# Patient Record
Sex: Female | Born: 1945 | Race: White | Hispanic: No | Marital: Married | Smoking: Never smoker
Health system: Southern US, Community
[De-identification: ages and names within clinical notes are randomized; demographics above are authoritative.]

## PROBLEM LIST (undated history)

## (undated) DIAGNOSIS — Z8739 Personal history of other diseases of the musculoskeletal system and connective tissue: Secondary | ICD-10-CM

## (undated) DIAGNOSIS — Z8619 Personal history of other infectious and parasitic diseases: Secondary | ICD-10-CM

## (undated) DIAGNOSIS — B379 Candidiasis, unspecified: Secondary | ICD-10-CM

## (undated) DIAGNOSIS — Z6791 Unspecified blood type, Rh negative: Secondary | ICD-10-CM

## (undated) DIAGNOSIS — I10 Essential (primary) hypertension: Secondary | ICD-10-CM

## (undated) DIAGNOSIS — M069 Rheumatoid arthritis, unspecified: Secondary | ICD-10-CM

## (undated) HISTORY — DX: Personal history of other diseases of the musculoskeletal system and connective tissue: Z87.39

## (undated) HISTORY — DX: Personal history of other infectious and parasitic diseases: Z86.19

## (undated) HISTORY — PX: WISDOM TOOTH EXTRACTION: SHX21

## (undated) HISTORY — DX: Rheumatoid arthritis, unspecified: M06.9

## (undated) HISTORY — DX: Essential (primary) hypertension: I10

## (undated) HISTORY — PX: TUBAL LIGATION: SHX77

## (undated) HISTORY — DX: Unspecified blood type, rh negative: Z67.91

## (undated) HISTORY — DX: Candidiasis, unspecified: B37.9

---

## 2012-06-12 ENCOUNTER — Other Ambulatory Visit: Payer: Self-pay | Admitting: Obstetrics and Gynecology

## 2012-06-12 ENCOUNTER — Ambulatory Visit (INDEPENDENT_AMBULATORY_CARE_PROVIDER_SITE_OTHER): Payer: Federal, State, Local not specified - PPO | Admitting: Obstetrics and Gynecology

## 2012-06-12 ENCOUNTER — Encounter: Payer: Self-pay | Admitting: Obstetrics and Gynecology

## 2012-06-12 ENCOUNTER — Ambulatory Visit: Payer: Federal, State, Local not specified - PPO

## 2012-06-12 VITALS — BP 130/80 | Ht 64.0 in | Wt 198.0 lb

## 2012-06-12 DIAGNOSIS — Z13820 Encounter for screening for osteoporosis: Secondary | ICD-10-CM

## 2012-06-12 DIAGNOSIS — Z8739 Personal history of other diseases of the musculoskeletal system and connective tissue: Secondary | ICD-10-CM | POA: Insufficient documentation

## 2012-06-12 DIAGNOSIS — Z01419 Encounter for gynecological examination (general) (routine) without abnormal findings: Secondary | ICD-10-CM

## 2012-06-12 DIAGNOSIS — Z6791 Unspecified blood type, Rh negative: Secondary | ICD-10-CM | POA: Insufficient documentation

## 2012-06-12 DIAGNOSIS — Z124 Encounter for screening for malignant neoplasm of cervix: Secondary | ICD-10-CM

## 2012-06-12 DIAGNOSIS — Z1231 Encounter for screening mammogram for malignant neoplasm of breast: Secondary | ICD-10-CM

## 2012-06-12 DIAGNOSIS — M069 Rheumatoid arthritis, unspecified: Secondary | ICD-10-CM | POA: Insufficient documentation

## 2012-06-12 DIAGNOSIS — B379 Candidiasis, unspecified: Secondary | ICD-10-CM | POA: Insufficient documentation

## 2012-06-12 DIAGNOSIS — I10 Essential (primary) hypertension: Secondary | ICD-10-CM | POA: Insufficient documentation

## 2012-06-12 DIAGNOSIS — N952 Postmenopausal atrophic vaginitis: Secondary | ICD-10-CM

## 2012-06-12 MED ORDER — ESTRADIOL 10 MCG VA TABS: 1.0000 | ORAL_TABLET | VAGINAL | Status: AC

## 2012-06-12 NOTE — Progress Notes (Signed)
The patient is not taking hormone replacement therapy The patient  is not taking a Calcium supplement. Post-menopausal bleeding:no  Last Pap: approximate date 2011 and was normal  Last mammogram: approximate date 2012 and was normal Last DEXA scan : T= 1.2 2009 Last colonoscopy:n/a  Urinary symptoms: none Normal bowel movements: Yes Reports abuse at home: No:  Subjective:    Vicki Parker is a 66 y.o. female G3P3 who presents for annual exam.  The patient has no complaints today.   The following portions of the patient's history were reviewed and updated as appropriate: allergies, current medications, past family history, past medical history, past social history, past surgical history and problem list.  Review of Systems Pertinent items are noted in HPI. Gastrointestinal:No change in bowel habits, no abdominal pain, no rectal bleeding Genitourinary:negative for dysuria, frequency, hematuria, nocturia and urinary incontinence    Objective:     There were no vitals taken for this visit.  Weight:  Wt Readings from Last 1 Encounters:  No data found for Wt     BMI: There is no height or weight on file to calculate BMI. General Appearance: Alert, appropriate appearance for age. No acute distress HEENT: Grossly normal Neck / Thyroid: Supple, no masses, nodes or enlargement Lungs: clear to auscultation bilaterally Back: No CVA tenderness Breast Exam: No masses or nodes.No dimpling, nipple retraction or discharge. Cardiovascular: Regular rate and rhythm. S1, S2, no murmur Gastrointestinal: Soft, non-tender, no masses or organomegaly Pelvic Exam: Vulva and vagina have moderate atrophy with vaginal petechia. Bimanual exam reveals normal uterus and adnexa. Rectovaginal: normal rectal, no masses Lymphatic Exam: Non-palpable nodes in neck, clavicular, axillary, or inguinal regions  Skin: no rash or abnormalities Neurologic: Normal gait and speech, no tremor  Psychiatric: Alert and  oriented, appropriate affect.     Assessment:    Normal gyn exam  Moderate atrophy   Plan:   mammogram pap smear return annually or prn DEXA: normal today Vagifem 2 x weekly for 3 months then PRN Follow-up:  for annual exam   Silverio Lay MD

## 2012-06-13 LAB — PAP IG W/ RFLX HPV ASCU

## 2012-06-14 ENCOUNTER — Encounter: Payer: Self-pay | Admitting: Internal Medicine

## 2012-07-24 ENCOUNTER — Ambulatory Visit
Admission: RE | Admit: 2012-07-24 | Discharge: 2012-07-24 | Disposition: A | Discharge status: Home / Self Care | Payer: Medicare Other | Source: Ambulatory Visit | Attending: Obstetrics and Gynecology | Admitting: Obstetrics and Gynecology

## 2012-07-24 DIAGNOSIS — Z1231 Encounter for screening mammogram for malignant neoplasm of breast: Secondary | ICD-10-CM

## 2013-06-19 ENCOUNTER — Other Ambulatory Visit: Payer: Self-pay

## 2013-06-19 DIAGNOSIS — Z1231 Encounter for screening mammogram for malignant neoplasm of breast: Secondary | ICD-10-CM

## 2013-07-20 ENCOUNTER — Inpatient Hospital Stay: Payer: Self-pay | Admitting: Specialist

## 2013-07-20 LAB — COMPREHENSIVE METABOLIC PANEL
Alkaline Phosphatase: 171 U/L — ABNORMAL HIGH
BUN: 7 mg/dL (ref 7–18)
Calcium, Total: 9 mg/dL (ref 8.5–10.1)
Chloride: 102 mmol/L (ref 98–107)
EGFR (African American): >60
Glucose: 160 mg/dL — ABNORMAL HIGH (ref 65–99)
Osmolality: 275 (ref 275–301)
Potassium: 3.4 mmol/L — ABNORMAL LOW (ref 3.5–5.1)
SGOT(AST): 97 U/L — ABNORMAL HIGH (ref 15–37)

## 2013-07-20 LAB — CBC WITH DIFFERENTIAL/PLATELET
Basophil: 2 %
Comment - H1-Com1: NORMAL
Eosinophil #: 0 10*3/uL (ref 0.0–0.7)
Eosinophil %: 0.3 %
HGB: 13.3 g/dL (ref 12.0–16.0)
Lymphocyte %: 10.2 %
MCHC: 34.5 g/dL (ref 32.0–36.0)
Monocyte %: 10 %
Monocytes: 12 %
Platelet: 296 10*3/uL (ref 150–440)
RDW: 13.6 % (ref 11.5–14.5)
Segmented Neutrophils: 73 %
Variant Lymphocyte - H1-Rlymph: 3 %

## 2013-07-20 LAB — RAPID INFLUENZA A&B ANTIGENS

## 2013-07-20 LAB — HEMOGLOBIN A1C: Hemoglobin A1C: 5.9 % (ref 4.2–6.3)

## 2013-07-21 LAB — HEPATIC FUNCTION PANEL A (ARMC)
Albumin: 2.6 g/dL — ABNORMAL LOW (ref 3.4–5.0)
Alkaline Phosphatase: 153 U/L — ABNORMAL HIGH
Bilirubin, Direct: 0.1 mg/dL (ref 0.00–0.20)
Bilirubin,Total: 0.5 mg/dL (ref 0.2–1.0)
SGOT(AST): 40 U/L — ABNORMAL HIGH (ref 15–37)
SGPT (ALT): 105 U/L — ABNORMAL HIGH (ref 12–78)

## 2013-07-21 LAB — MAGNESIUM: Magnesium: 2.2 mg/dL

## 2013-07-21 LAB — BASIC METABOLIC PANEL
BUN: 6 mg/dL — ABNORMAL LOW (ref 7–18)
Calcium, Total: 8.7 mg/dL (ref 8.5–10.1)
Chloride: 103 mmol/L (ref 98–107)
Co2: 26 mmol/L (ref 21–32)
EGFR (Non-African Amer.): >60
Glucose: 143 mg/dL — ABNORMAL HIGH (ref 65–99)
Potassium: 4.2 mmol/L (ref 3.5–5.1)

## 2013-07-21 LAB — CBC WITH DIFFERENTIAL/PLATELET
Basophil %: 0.4 %
Eosinophil #: 0.1 10*3/uL (ref 0.0–0.7)
HGB: 11.8 g/dL — ABNORMAL LOW (ref 12.0–16.0)
Lymphocyte #: 1.5 10*3/uL (ref 1.0–3.6)
Lymphocyte %: 8.8 %
MCHC: 34 g/dL (ref 32.0–36.0)
Neutrophil %: 79.4 %
RBC: 3.8 10*6/uL (ref 3.80–5.20)

## 2013-07-22 LAB — CBC WITH DIFFERENTIAL/PLATELET
Basophil #: 0.1 10*3/uL (ref 0.0–0.1)
Basophil %: 0.6 %
Eosinophil #: 0.4 10*3/uL (ref 0.0–0.7)
Eosinophil %: 3.1 %
HGB: 12.1 g/dL (ref 12.0–16.0)
Lymphocyte #: 1.9 10*3/uL (ref 1.0–3.6)
Lymphocyte %: 16.9 %
MCH: 30.5 pg (ref 26.0–34.0)
MCHC: 33.4 g/dL (ref 32.0–36.0)
Monocyte %: 13.7 %
Neutrophil #: 7.4 10*3/uL — ABNORMAL HIGH (ref 1.4–6.5)
Neutrophil %: 65.7 %
RBC: 3.98 10*6/uL (ref 3.80–5.20)
RDW: 13.7 % (ref 11.5–14.5)
WBC: 11.3 10*3/uL — ABNORMAL HIGH (ref 3.6–11.0)

## 2013-07-25 LAB — CULTURE, BLOOD (SINGLE)

## 2013-07-28 ENCOUNTER — Ambulatory Visit
Admission: RE | Admit: 2013-07-28 | Discharge: 2013-07-28 | Disposition: A | Discharge status: Home / Self Care | Payer: Medicare Other | Source: Ambulatory Visit

## 2013-07-28 DIAGNOSIS — Z1231 Encounter for screening mammogram for malignant neoplasm of breast: Secondary | ICD-10-CM

## 2013-08-28 ENCOUNTER — Ambulatory Visit: Payer: Self-pay

## 2014-06-15 ENCOUNTER — Encounter: Payer: Self-pay | Admitting: Obstetrics and Gynecology

## 2014-06-19 ENCOUNTER — Other Ambulatory Visit: Payer: Self-pay

## 2014-06-19 DIAGNOSIS — Z1231 Encounter for screening mammogram for malignant neoplasm of breast: Secondary | ICD-10-CM

## 2014-07-29 ENCOUNTER — Ambulatory Visit
Admission: RE | Admit: 2014-07-29 | Discharge: 2014-07-29 | Disposition: A | Discharge status: Home / Self Care | Payer: Medicare Other | Source: Ambulatory Visit

## 2014-07-29 DIAGNOSIS — Z1231 Encounter for screening mammogram for malignant neoplasm of breast: Secondary | ICD-10-CM

## 2014-12-04 NOTE — H&P (Signed)
PATIENT NAME:  Vicki Parker, Vicki Parker MR#:  [id number] DATE OF BIRTH:  1945/12/06  DATE OF ADMISSION:  07/20/2013  PRIMARY CARE PHYSICIAN: None.  REFERRING PHYSICIAN:  Dr. Corky Downs  CHIEF COMPLAINT: Shortness of breath and cough.  HISTORY OF PRESENT ILLNESS: The patient is a pleasant, 69 year old, obese female, with a history of rheumatoid arthritis, OA, and hypertension. The patient, of note, is on Enbrel for her rheumatoid arthritis, and previously was on methotrexate. She states that around Thanksgiving she had some contacts who came in and had upper respiratory symptoms. They were some grandkids. She started to have some body aches and head cold with nasal congestion and cough. She started to have progression of her symptoms, and around Thursday started to have fevers, went to an urgent care walk-in clinic, and was given azithromycin  after a nose swab. She does not know what the nose swab was. She has taken 3 days of azithromycin; however, has not gotten better. She came into the hospital, where she was found tachycardic, with a fever greater than 102, and white count of 18, with evidence for pneumonia. She also had O2 sat of mid-80s with ambulation. Hospitalist services were contacted for further evaluation and management.   PAST MEDICAL HISTORY: 1.  RA.  2.  OA. 3.  Hypertension.   ALLERGIES: None.   SURGERIES:  C-section x 3.   FAMILY HISTORY: Father with stroke.   SOCIAL HISTORY:  Ex-smoker, quit more than 20 years ago. Occasional alcohol. No drugs.   OUTPATIENT MEDICATIONS: Enbrel q. weekly, did not take the last dose, as she was instructed by her rheumatologist to not take it while she was on the antibiotics. Aspirin 81 mg daily, Exforge 10/320, 1 tab once a day, Flonase 1 spray once a day, folic acid 1 mg daily, hydrochlorothiazide 12.5 mg daily, Hydromet 5 mg every 4 hours as needed, potassium chloride 10 mEq 2 times a day, and Z-Pak, today is her day 4.   REVIEW OF SYSTEMS:   CONSTITUTIONAL: Positive for fever, fatigue, weakness.  EYES: No blurry vision or double vision.  ENT: No tinnitus or hearing loss. No sore throat.  RESPIRATORY: Positive for cough. No wheezing. Cough has been dry up until some neb treatments, after which she has had some productive sputum today. The patient has pain with respiration on the right side. She is short of breath as well.  CARDIOVASCULAR: No left-sided chest pain. No orthopnea or swelling in the legs. Has hypertension.  GASTROINTESTINAL: No nausea, vomiting, diarrhea, abdominal pain, black stools or dark tarry stools.  GENITOURINARY: Denies dysuria, hematuria. Denies frequency, but has had some incontinence with her coughing spells.  ENDOCRINE: Denies polyuria or nocturia.  HEMATOLOGIC AND LYMPHATIC: Denies anemia or easy bruising.  SKIN: No rashes.  MUSCULOSKELETAL: Has rheumatoid arthritis and osteoarthritis. NEUROLOGIC:  No focal weakness or numbness.  PSYCHIATRIC: Denies anxiety or depression.   PHYSICAL EXAMINATION: VITAL SIGNS: Temperature on arrival was 102.5, pulse rate 112, respiratory rate 20, blood pressure 140/69, O2 sat 93% resting on room air, but dropped to mid-80s with ambulation. GENERAL: The patient is an obese female lying in bed in no obvious distress.  HEENT: Normocephalic, atraumatic. Pupils are equal and reactive. Anicteric sclerae. Extraocular muscles intact. Somewhat dry mucous membranes.  NECK: Supple. No thyroid tenderness. No cervical lymphadenopathy.  CARDIOVASCULAR: S1, S2. Tachycardic. No significant murmurs, rubs or gallops.  LUNGS: On the right side, there is rhonchi and mild wheezing. Good air entry on the left.  ABDOMEN: Soft, nontender, nondistended.  Positive bowel sounds in all quadrants.  EXTREMITIES: No significant lower extremity edema.  SKIN: No obvious rashes.  NEUROLOGIC: Cranial nerves II through XII grossly intact. Strength is 5/5 all extremities. Sensation intact to light touch.   PSYCHIATRIC: Awake, alert, oriented x 3. Pleasant, cooperative.   LABS AND IMAGING:  CT chest without contrast shows patchy opacities in the posterior right lower lobe suspicious for pneumonia. X-ray of the chest: No active cardiopulmonary disease. Rapid flu negative. White count 18,000. Bilirubin 0.7, alk phos 171, AST 97, ALT 153. BUN 7, creatinine 0.74, sodium 137, potassium 3.4, glucose 160.   ASSESSMENT AND PLAN: We have a pleasant, 69 year old female, with rheumatoid arthritis, on weekly Enbrel injections, who has been having progressive respiratory symptoms since about Thanksgiving, with some sick contacts. Currently, she appears to have severe sepsis, per criteria, including tachycardia, leukocytosis, fever, in the setting of pneumonia, in addition to respiratory failure, which is acute. Acute respiratory failure is secondary to the pneumonia, I believe. At this point, would admit the patient to the hospital, start her on oxygen as needed, and start her on Levaquin, and given the situation with Enbrel, I would also go ahead and add Rocephin at this point for double coverage. I would order sputum cultures and start the patient on nebs and  Robitussin for her symptoms. She appears to be talking in full sentences at this point, and does not appear to be significantly labored, and if she does not respond with these 2 antibiotics, I would consider broadening the antibiotics. I would also check a urine Legionella and strep antigens. Although the symptoms initially started as viral, I believe that she has developed a secondary bacterial pneumonia superinfection. She has been tested negative for rapid flu here, and as her symptoms started greater than 10 days ago, I suspect this is likely, as it started off being viral, is more likely bacterial at this point. She has mild hypokalemia, and I would go ahead and replete that. The patient has mild transaminitis, and I would go ahead and recheck another in the  morning, and if they are persistently elevated, would consider ultrasound of right upper quadrant, but I think this is likely secondary to her sepsis. In regards to the hypertension, I would go ahead and continue some of her blood pressure medications. I would start her on Lovenox for deep vein thrombosis prophylaxis.   Total time spent is 60 minutes.   The patient is FULL CODE.    ____________________________ Vivien Presto, MD sa:mr D: 07/20/2013 14:07:24 ET T: 07/20/2013 16:20:42 ET JOB#: [id number]  cc: Vivien Presto, MD, <Dictator> Vivien Presto MD ELECTRONICALLY SIGNED 07/29/2013 11:03

## 2014-12-04 NOTE — Discharge Summary (Signed)
PATIENT NAME:  Vicki DinningENGERES, Alton MR#:  [id number] DATE OF BIRTH:  [birth year]  DATE OF ADMISSION:  07/20/2013 DATE OF DISCHARGE:  07/22/2013  For a detailed note, please look at the history and physical done at admission by Dr. Jacques NavyAhmadzia.     DIAGNOSES AT DISCHARGE: As follows:  1.  Systemic inflammatory response syndrome secondary to pneumonia. 2.  Pneumonia.  3.  Hypertension.  4.  Rheumatoid arthritis.   DIET: The patient is being discharged on a low-sodium diet.   ACTIVITY: As tolerated.   FOLLOW-UP:  With her primary care physician, in the next 1 to 2 weeks.   DISCHARGED MEDICATIONS:  Toprol 50 mg b.i.d., Exforge 10/320, one tab daily, HCTZ 12.5 mg daily, aspirin 81 mg daily, folic acid 1 mg daily, methotrexate 2.5 mg weekly, leucovorin 5 mg weekly, Flonase one spray to each nostril daily, Hydromet 1.5 mg/5 mg 5 mL syrup 5 mL q. 4 hours as needed, potassium 10 mEq daily, Levaquin 750 mg daily x 7 days.   PERTINENT STUDIES DONE DURING THE HOSPITAL COURSE: Is as follows: Chest x-ray done on admission showing no acute cardiopulmonary disease. CT of the chest done without contrast on admission showing multifocal patchy opacities in the posterior right lower lobe consistent with pneumonia.   HOSPITAL COURSE: This is a 69 year old female with medical problems as mentioned above, presented to the hospital with fever, tachycardia, leukocytosis and shortness of breath and likely secondary to pneumonia.   PROBLEMS: 1.  Systemic inflammatory response syndrome. This was likely secondary to pneumonia in the right lower lobe. The patient was treated empirically with IV ceftriaxone and Levaquin and given IV fluids and antipyretics. The patient has clinically significantly improved since admission. She has been afebrile now for the past 48 hours and hemodynamically stable. Her blood cultures remained negative. She clinically feels no shortness of breath or any pleurisy or any cough or upper respiratory  symptoms. She is being discharged to oral Levaquin for suspected pneumonia.  2.  Pneumonia, likely community-acquired. The patient was treated with IV ceftriaxone and Levaquin while in the hospital. Her blood cultures remained negative. She is currently afebrile and hemodynamically stable. She is being discharged on oral Levaquin for the next 7 days.  3.  Leukocytosis. This was likely secondary to pneumonia. It has significantly improved and now resolved with IV antibiotic therapy.  4.  Constipation. The patient has not had a BM for 3 to 4 days prior to coming to the hospital. She received some lactulose and this has worked effectively and she had a BM yesterday.  5.  Hypertension. The patient remained hemodynamically stable. She will continue her home medications including her Toprol, HCTZ and Exforge as stated.  6.  Rheumatoid arthritis. The patient will continue follow-up with her rheumatologist. Continue methotrexate and leucovorin and folic acid supplements as stated.   CODE STATUS: The patient is a FULL CODE.   TIME SPENT ON DISCHARGE: 40 minutes   ____________________________ Rolly PancakeVivek J. Cherlynn KaiserSainani, MD vjs:dp D: 07/22/2013 14:49:00 ET T: 07/22/2013 15:48:32 ET JOB#: [id number]  cc: Rolly PancakeVivek J. Cherlynn KaiserSainani, MD, <Dictator> Krystal EatonShayiq Ahmadzia, MD Houston SirenVIVEK J SAINANI MD ELECTRONICALLY SIGNED 07/24/2013 14:32

## 2015-03-16 IMAGING — CR DG CHEST 2V
1 series · 2 of 2 positions shown · non-contrast
Comparison: None.

CLINICAL DATA: Cough x1 week

EXAM:
CHEST  2 VIEW

[Series 1: w chest pa · 0.14mm/px · 2 of 2 slices shown]
[im 1/2]
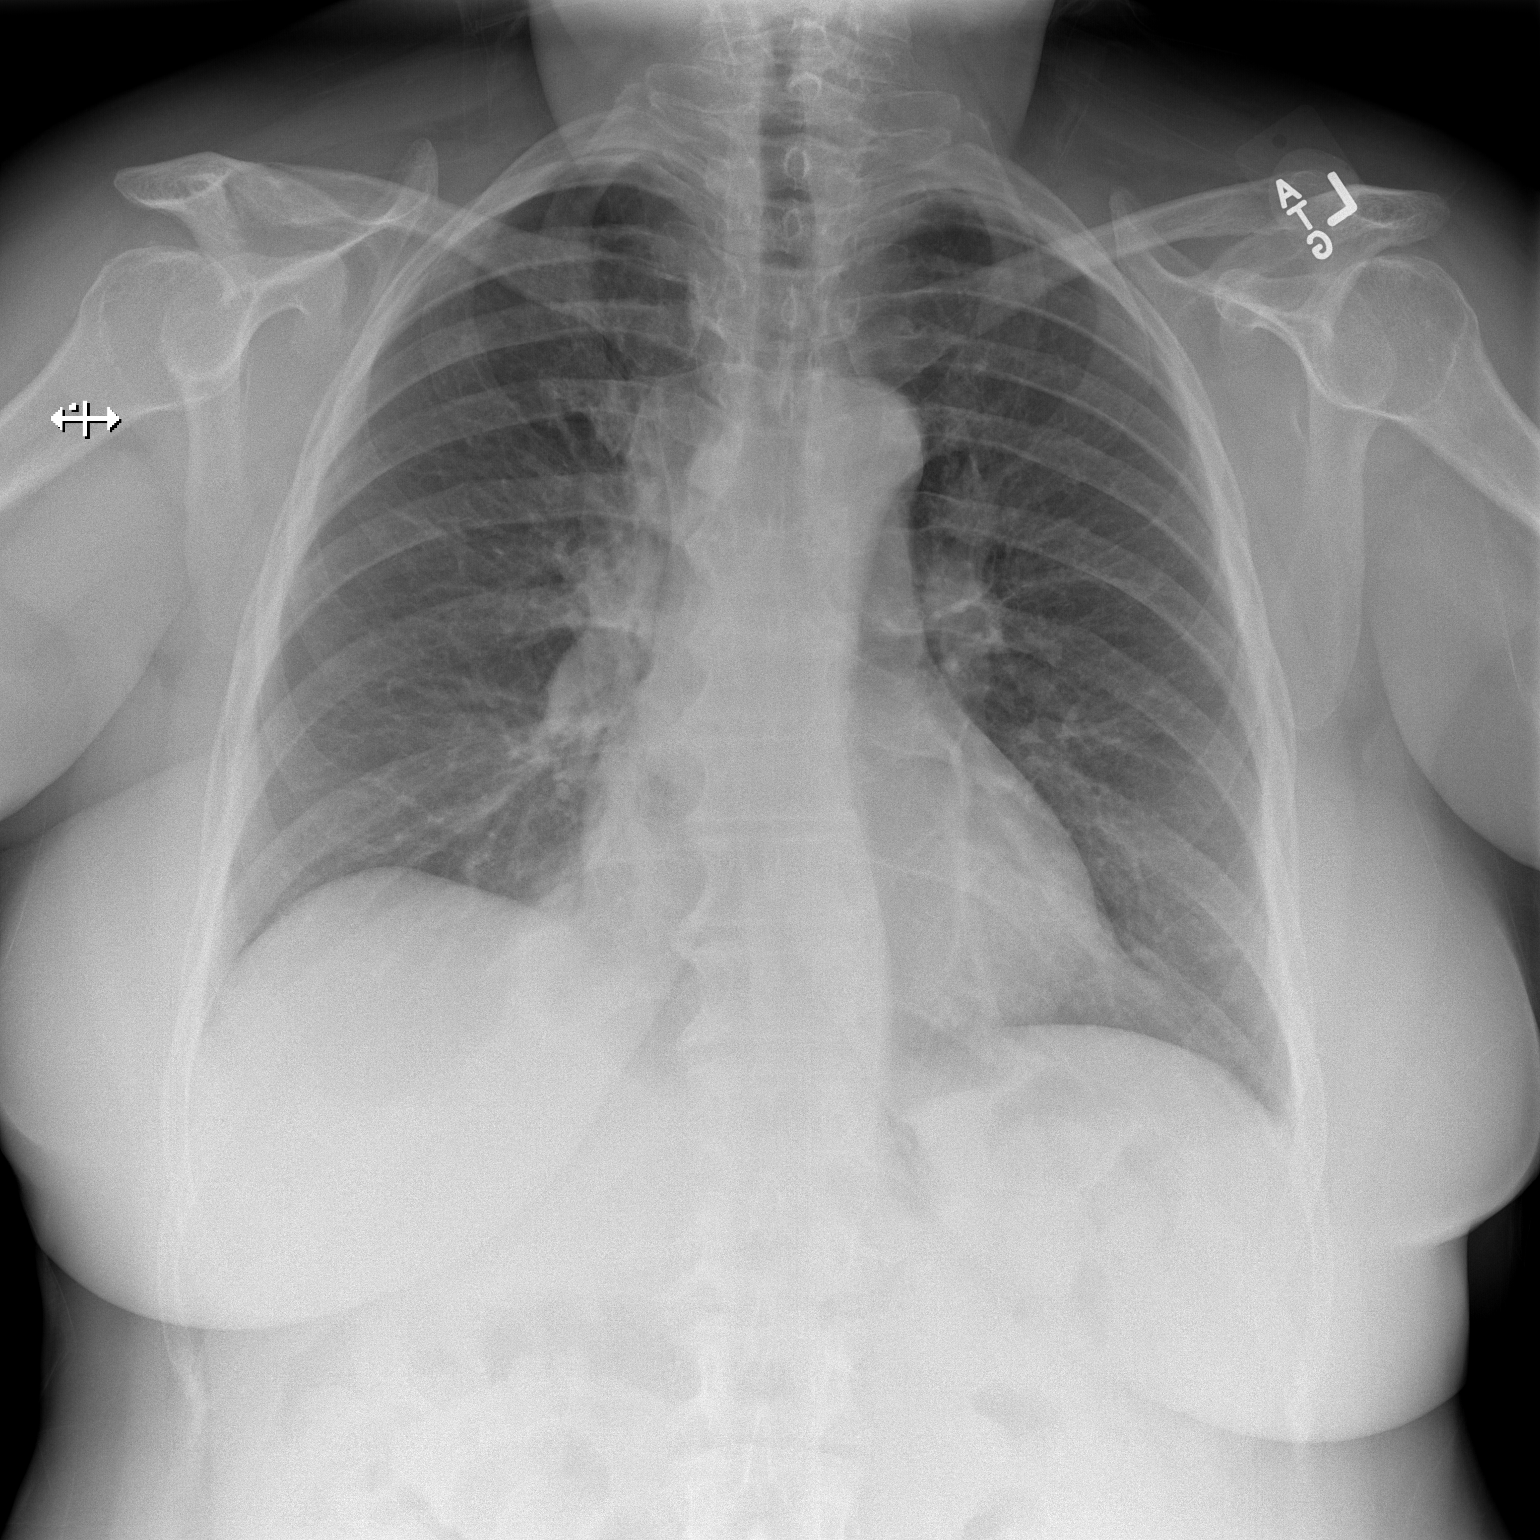
[im 2/2]
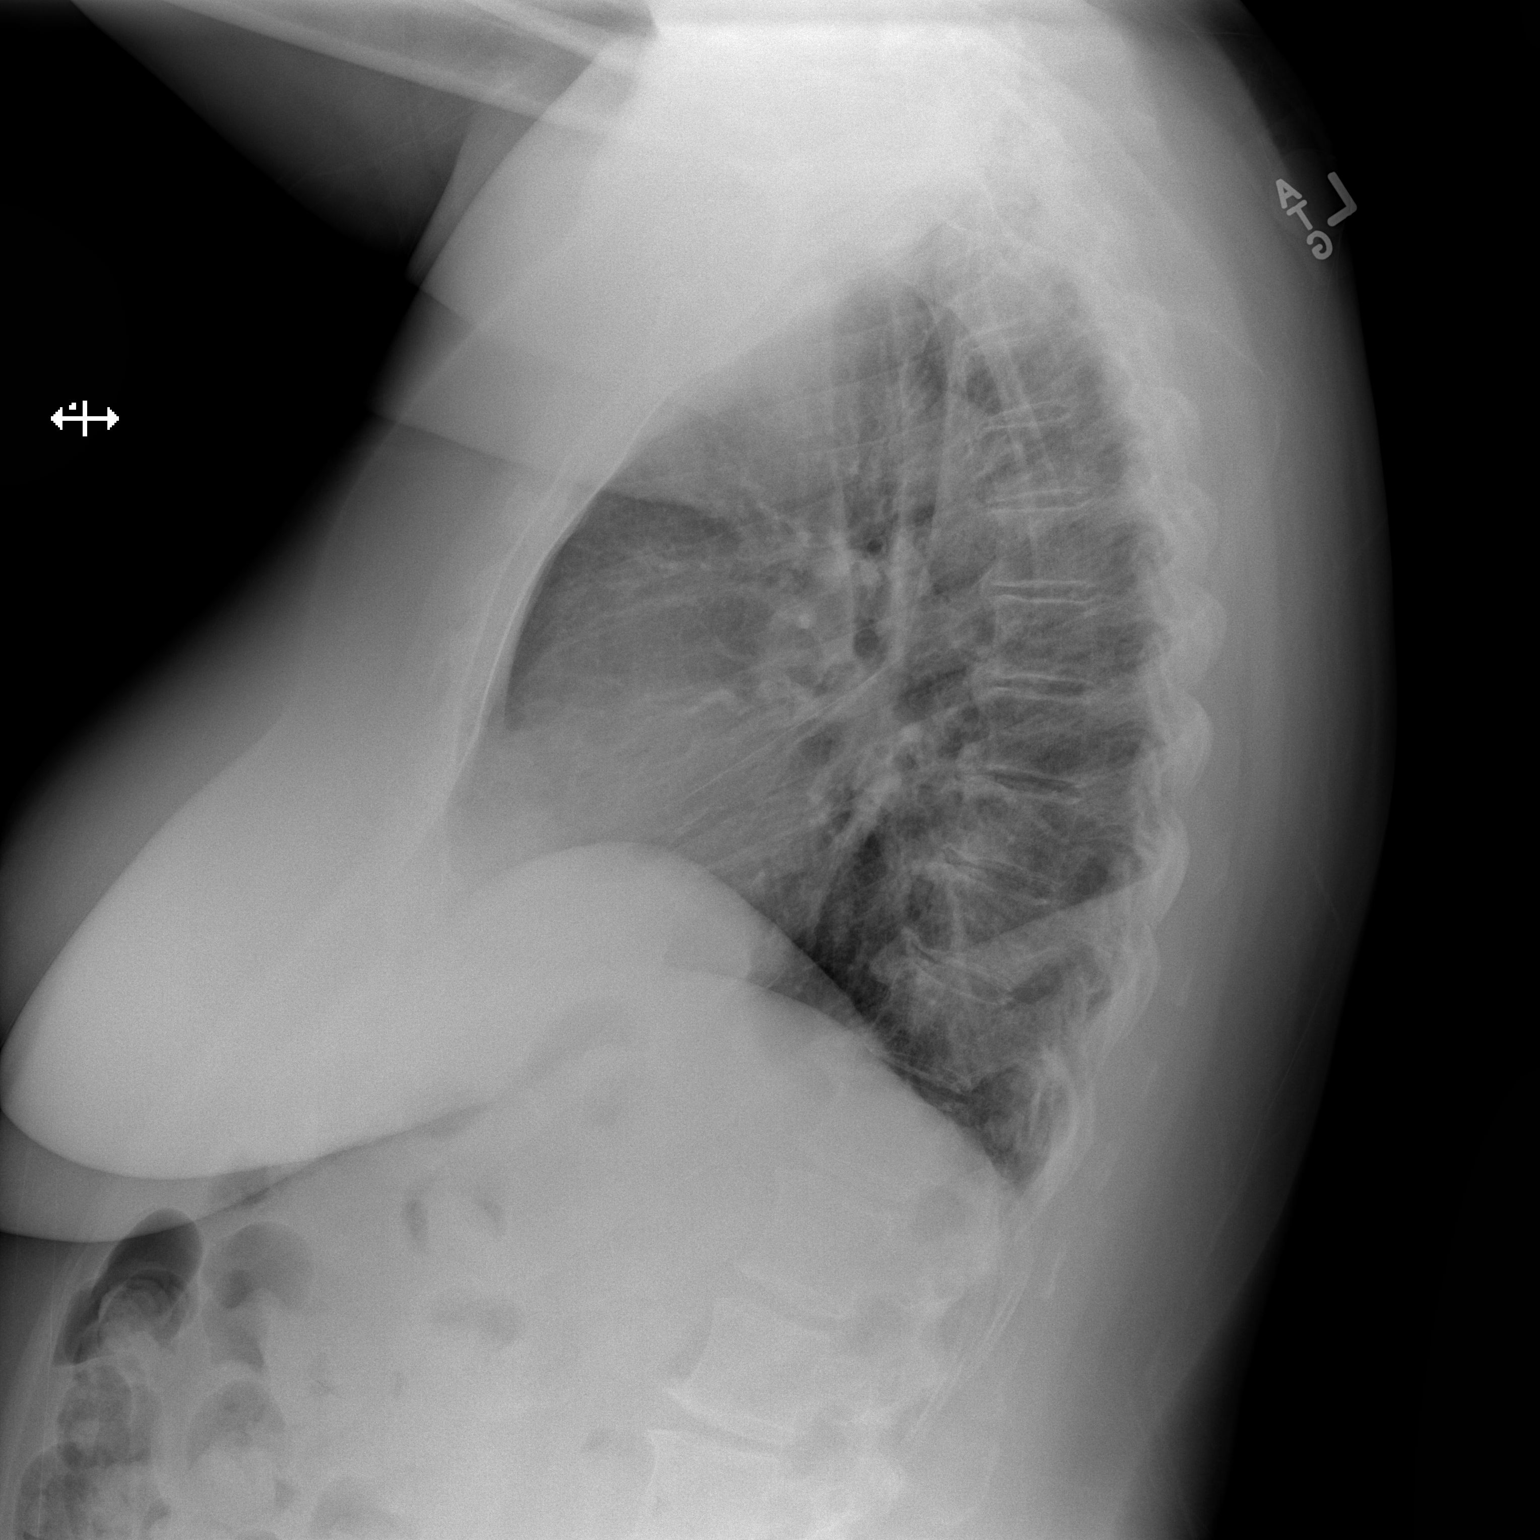

[2 of 2 positions shown; findings below may reference images not displayed]

FINDINGS: The heart size and mediastinal contours are within normal limits.
Both lungs are clear. The visualized skeletal structures are
unremarkable.
IMPRESSION: No active cardiopulmonary disease.

## 2015-03-16 IMAGING — CT CT CHEST W/O CM
2 of 3 series · 15 of 36 positions shown, 18 images · non-contrast
Comparison: Chest radiographs dated 07/20/2013

CLINICAL DATA: Cough, fever, right rib pain

EXAM:
CT CHEST WITHOUT CONTRAST
TECHNIQUE: Multidetector CT imaging of the chest was performed following the
standard protocol without IV contrast.

[Series 2: routine chest wo · axial · 0.68mm/px · z∈[+143,+418]mm · 12 of 65 slices shown, 15 images]
[im 5/65  mediastinal]
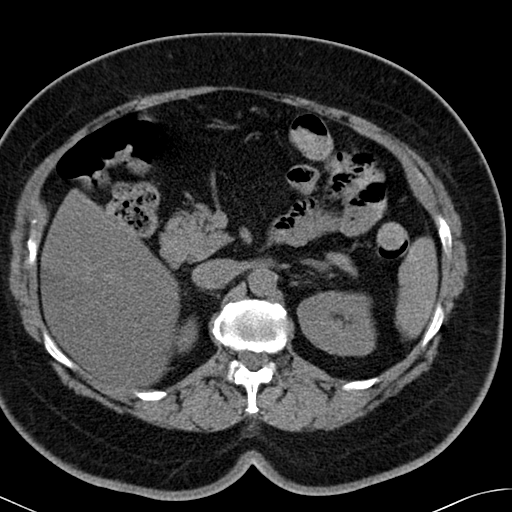
[im 5/65  lung]
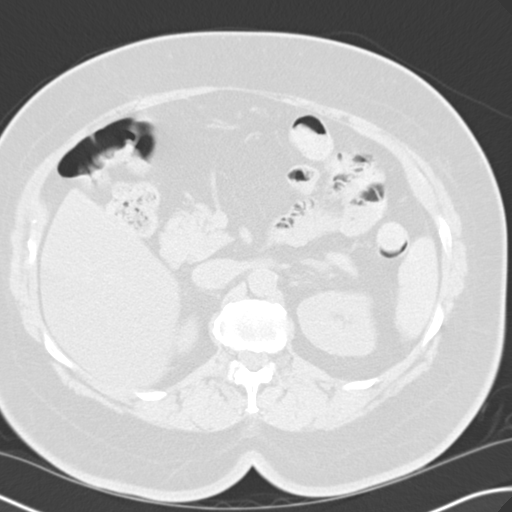
[im 10/65  lung]
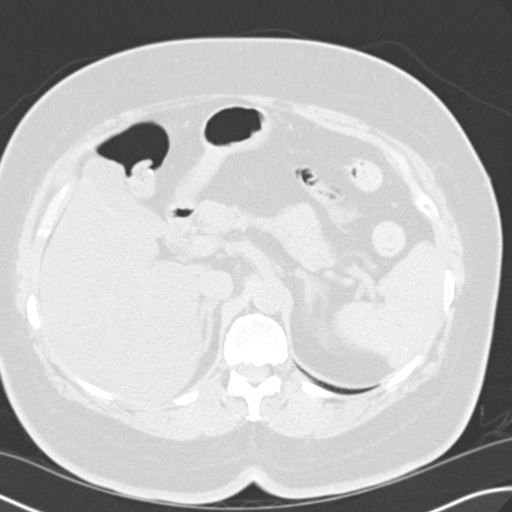
[im 15/65  lung]
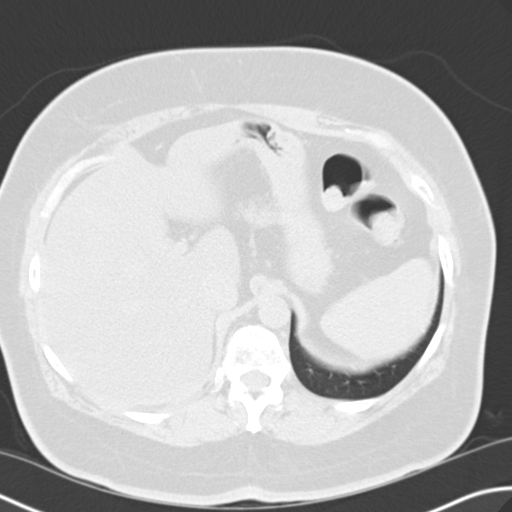
[im 19/65  lung]
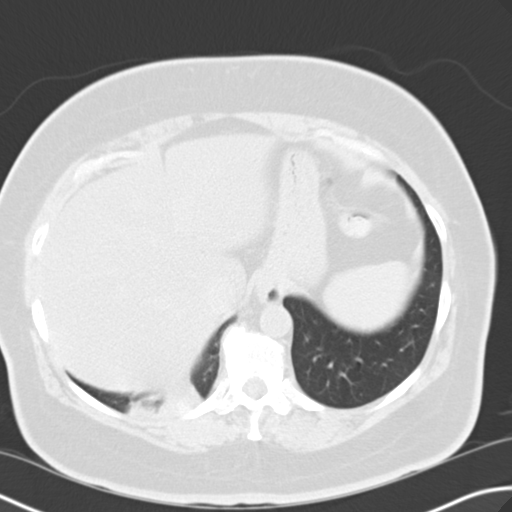
[im 24/65  mediastinal]
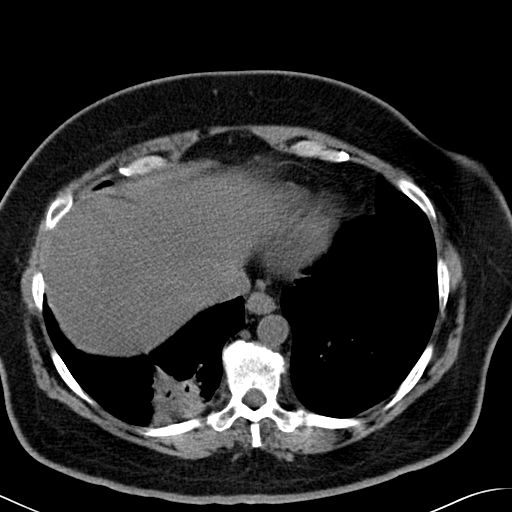
[im 24/65  lung]
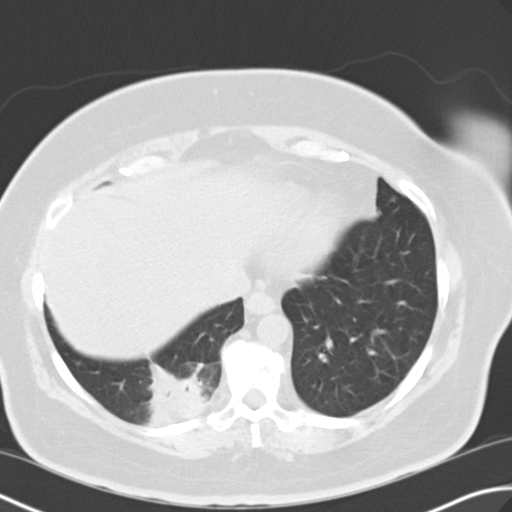
[im 29/65  lung]
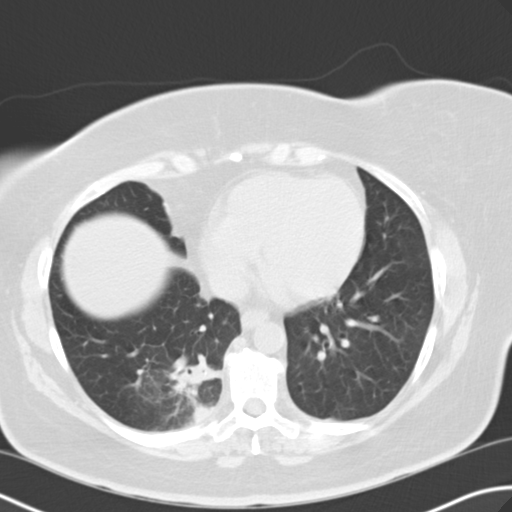
[im 36/65  lung]
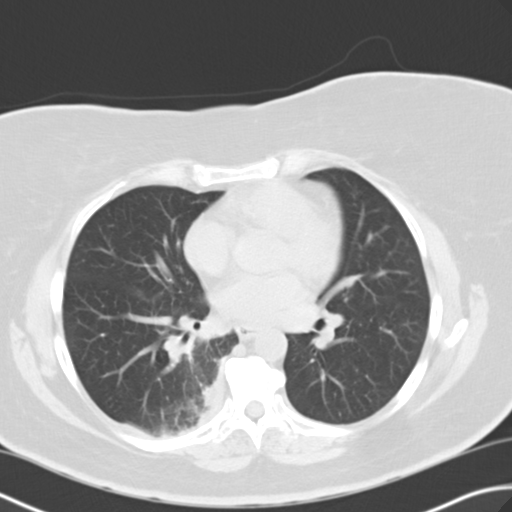
[im 41/65  lung]
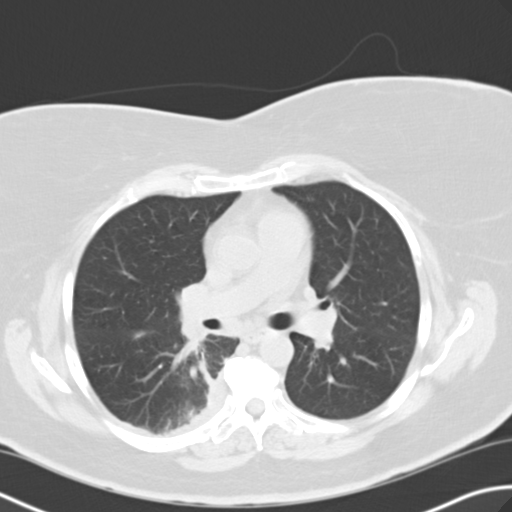
[im 46/65  mediastinal]
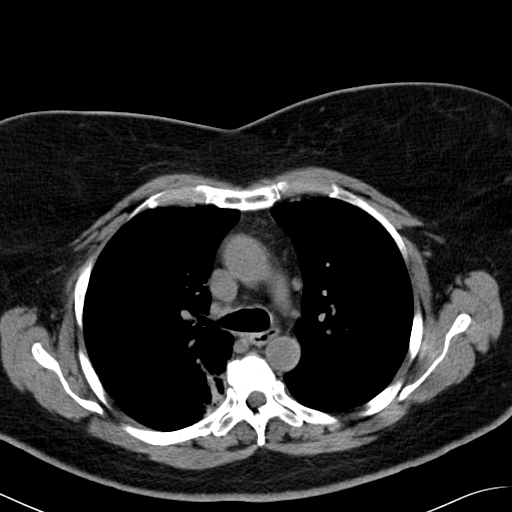
[im 46/65  lung]
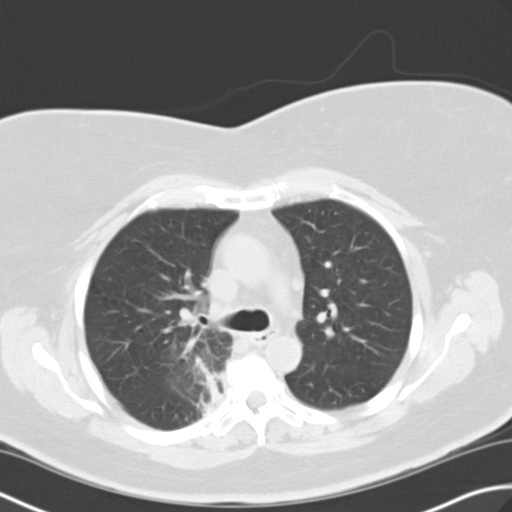
[im 50/65  lung]
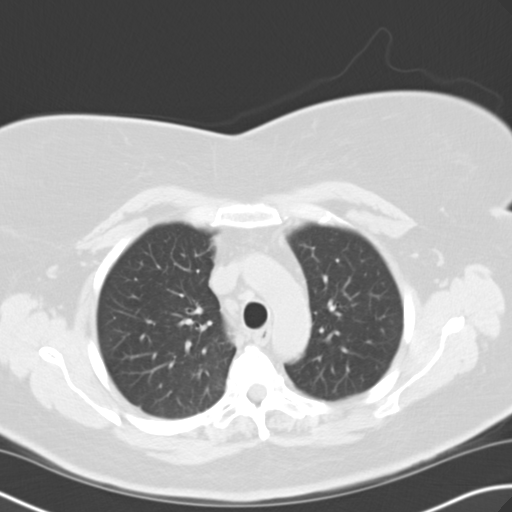
[im 55/65  lung]
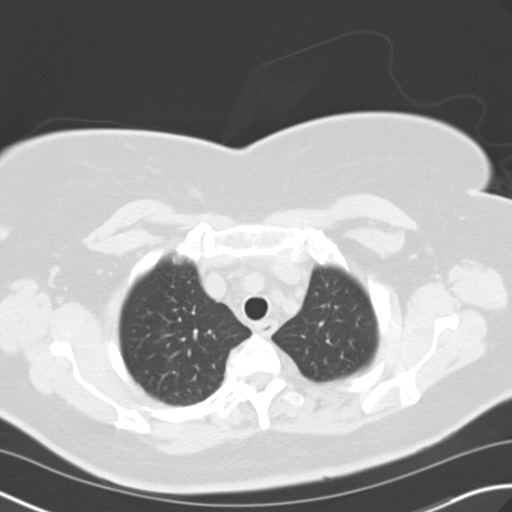
[im 60/65  lung]
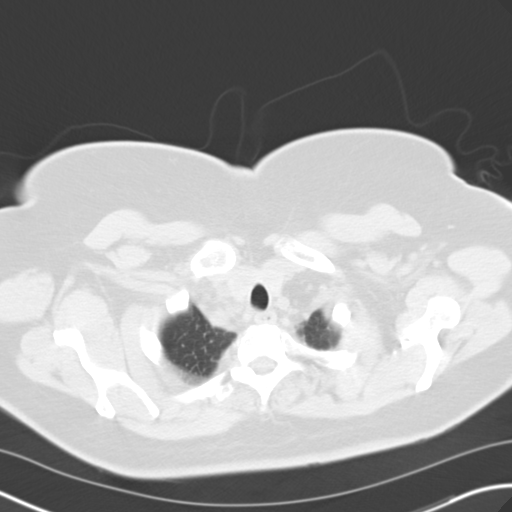

[Series 5: cor routine chest wo · coronal · 0.64mm/px · 3 of 154 slices shown]
[im 31/154  lung]
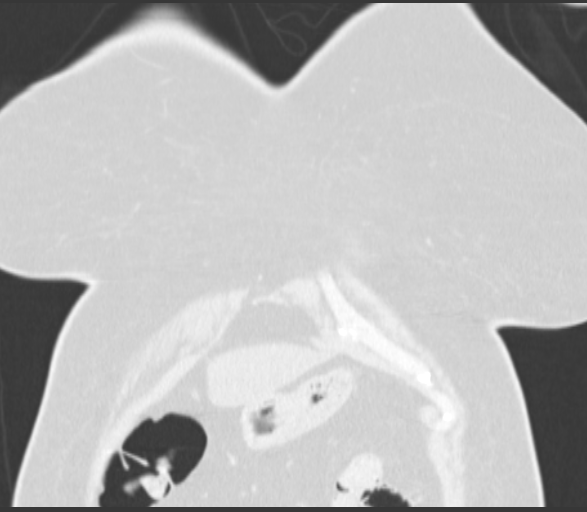
[im 62/154  lung]
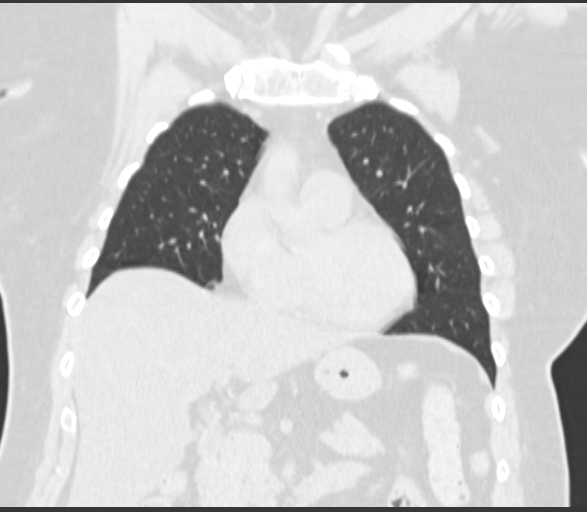
[im 92/154  lung]
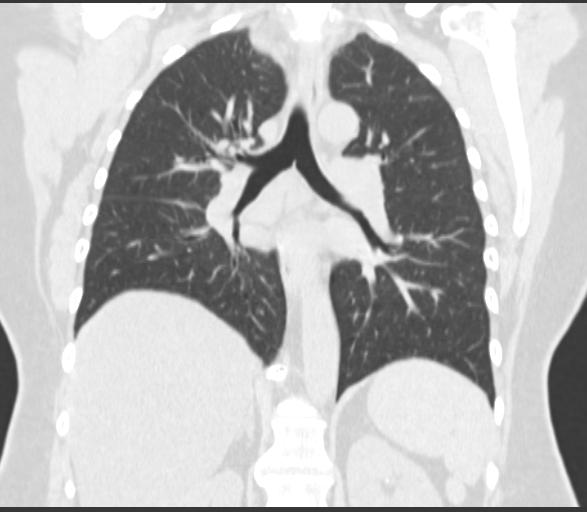

[15 of 36 positions shown; findings below may reference images not displayed]

FINDINGS: Multifocal patchy opacities in the posterior right lower lobe, most
prominent at the right lung base (series 3/ image 41), suspicious
for pneumonia. No pleural effusion or pneumothorax.

Visualized thyroid is unremarkable.

Heart is normal size.  No pericardial effusion.

Small mediastinal lymph nodes, including a 10 mm short axis
subcarinal node (series 2/ image 27), likely reactive. No suspicious
axillary lymphadenopathy.

Visualized upper abdomen is notable for hepatic steatosis with focal
fatty sparing and a tiny hiatal hernia.

Degenerative changes of the visualized thoracolumbar spine.
IMPRESSION: Multifocal patchy opacities in the posterior right lower lobe,
suspicious for pneumonia.

## 2018-04-02 ENCOUNTER — Encounter: Payer: Self-pay | Admitting: Emergency Medicine

## 2018-04-02 ENCOUNTER — Other Ambulatory Visit: Payer: Self-pay

## 2018-04-02 ENCOUNTER — Emergency Department
Admission: EM | Admit: 2018-04-02 | Discharge: 2018-04-02 | Disposition: A | Discharge status: Home / Self Care | Payer: Medicare Other | Attending: Emergency Medicine | Admitting: Emergency Medicine

## 2018-04-02 DIAGNOSIS — Z79899 Other long term (current) drug therapy: Secondary | ICD-10-CM | POA: Diagnosis not present

## 2018-04-02 DIAGNOSIS — S3992XA Unspecified injury of lower back, initial encounter: Secondary | ICD-10-CM | POA: Diagnosis present

## 2018-04-02 DIAGNOSIS — Y939 Activity, unspecified: Secondary | ICD-10-CM | POA: Insufficient documentation

## 2018-04-02 DIAGNOSIS — S30860A Insect bite (nonvenomous) of lower back and pelvis, initial encounter: Secondary | ICD-10-CM | POA: Diagnosis not present

## 2018-04-02 DIAGNOSIS — Z7982 Long term (current) use of aspirin: Secondary | ICD-10-CM | POA: Insufficient documentation

## 2018-04-02 DIAGNOSIS — I10 Essential (primary) hypertension: Secondary | ICD-10-CM | POA: Diagnosis not present

## 2018-04-02 DIAGNOSIS — Y92828 Other wilderness area as the place of occurrence of the external cause: Secondary | ICD-10-CM | POA: Diagnosis not present

## 2018-04-02 DIAGNOSIS — Y999 Unspecified external cause status: Secondary | ICD-10-CM | POA: Diagnosis not present

## 2018-04-02 DIAGNOSIS — W57XXXA Bitten or stung by nonvenomous insect and other nonvenomous arthropods, initial encounter: Secondary | ICD-10-CM | POA: Insufficient documentation

## 2018-04-02 MED ORDER — DOXYCYCLINE MONOHYDRATE 100 MG PO CAPS: 100.0000 mg | ORAL_CAPSULE | Freq: Two times a day (BID) | ORAL | 0 refills | Status: AC

## 2018-04-02 NOTE — ED Notes (Signed)
Has wound on inner buttock that looked infected with black center yesterday.  Today no black in center.  [place]  Never saw the bug.  In [place].

## 2018-04-02 NOTE — ED Triage Notes (Signed)
Patient states she was in the woods last week and thinks she may have been bitten by a tick.  States she had a fever last Friday 100.4.  Complaining of feeling tired and weak, found area on right side of buttocks last PM, questions tick bite.  Has pictures.

## 2018-04-02 NOTE — Discharge Instructions (Addendum)
You will be prophylactically treated for tick bite.  Did not start shingles shots until 24-hour after last dose of doxycycline.

## 2018-04-02 NOTE — ED Provider Notes (Signed)
[facility] Emergency Department Provider Note   ____________________________________________   First MD Initiated Contact with Patient 04/02/18 1040     (approximate)  I have reviewed the triage vital signs and the nursing notes.   HISTORY  Chief Complaint Tick Removal    HPI Vicki Parker is a 72 y.o. female patient presents with suspected tick bite to the right buttocks.  Patient states she was in the woods 5 days ago.  Patient states she had a fever 100.44 days ago.  Patient state intimating myalgia and arthralgia.  Patient stated husband noticed a black spot on her buttocks last p.m.  Patient state her suspect was a tick and tried unsuccessfully to remove it.  Patient awakened this morning and noticed absence of black spot on her right buttocks.  Patient denies fever/chills, myalgia or arthralgia today.  Past Medical History:  Diagnosis Date  . Blood type, Rh negative   . H/O joint problems   . History of chicken pox   . Hypertension   . Rheumatoid arthritis(714.0)   . Yeast infection     Patient Active Problem List   Diagnosis Date Noted  . Yeast infection   . Blood type, Rh negative   . Rheumatoid arthritis(714.0)   . Hypertension   . H/O joint problems     Past Surgical History:  Procedure Laterality Date  . CESAREAN SECTION    . TUBAL LIGATION    . WISDOM TOOTH EXTRACTION      Prior to Admission medications   Medication Sig Start Date End Date Taking? Authorizing Provider  amLODipine-valsartan (EXFORGE) 10-320 MG per tablet Take 1 tablet by mouth daily.   Yes [provider]  aspirin 81 MG tablet Take 81 mg by mouth daily.   Yes [provider]  Estradiol (VAGIFEM) 10 MCG TABS Place 1 tablet (10 mcg total) vaginally 2 (two) times a week. 06/12/12  Yes Rivard, Dois DavenportSandra, MD  metoprolol succinate (TOPROL-XL) 100 MG 24 hr tablet Take 100 mg by mouth 2 (two) times daily. Take with or immediately following a meal.   Yes  [provider]  Multiple Vitamins-Minerals (CENTRUM SILVER PO) Take by mouth.   Yes [provider]  Biotin 10 MG CAPS Take by mouth.    [provider]  doxycycline (MONODOX) 100 MG capsule Take 1 capsule (100 mg total) by mouth 2 (two) times daily. 04/02/18   Joni ReiningSmith, Ronald K, PA-C  folic acid (FOLVITE) 1 MG tablet Take 1 mg by mouth 2 (two) times daily.    [provider]  hydrochlorothiazide (MICROZIDE) 12.5 MG capsule Take 12.5 mg by mouth daily.    [provider]  leucovorin (WELLCOVORIN) 5 MG tablet Take 5 mg by mouth once a week.    [provider]  methotrexate (RHEUMATREX) 2.5 MG tablet Take 7.5 mg by mouth once a week.    [provider]  potassium chloride (K-DUR,KLOR-CON) 10 MEQ tablet Take 10 mEq by mouth 2 (two) times daily.    [provider]  Vitamin D, Ergocalciferol, (DRISDOL) 50000 units CAPS capsule  03/01/18   [provider]  Harriette OharaXELJANZ XR 11 MG TB24  02/25/18   [provider]    Allergies Enbrel [etanercept]  Family History  Problem Relation Age of Onset  . Stroke Father   . Cancer Maternal Grandmother     Social History Social History   Tobacco Use  . Smoking status: Never Smoker  . Smokeless tobacco: Never Used  Substance Use Topics  . Alcohol use: No  . Drug use: No    Review of Systems Constitutional: No fever/chills Eyes: No visual changes. ENT: No sore throat. Cardiovascular: Denies chest pain. Respiratory: Denies shortness of breath. Gastrointestinal: No abdominal pain.  No nausea, no vomiting.  No diarrhea.  No constipation. Genitourinary: Negative for dysuria. Musculoskeletal: Negative for back pain. Skin: Negative for rash. Neurological: Negative for headaches, focal weakness or numbness. Endocrine:Hypertension and rheumatoid arthritis. Hematological/Lymphatic: Allergic/Immunilogical: Enbrel ____________________________________________   PHYSICAL  EXAM:  VITAL SIGNS: ED Triage Vitals  Enc Vitals Group     BP 04/02/18 1016 (!) 142/89     Pulse Rate 04/02/18 1016 78     Resp 04/02/18 1016 16     Temp 04/02/18 1016 98.2 F (36.8 C)     Temp Source 04/02/18 1016 Oral     SpO2 04/02/18 1016 95 %     Weight 04/02/18 1017 214 lb (97.1 kg)     Height 04/02/18 1017 5' 4.5" (1.638 m)     Head Circumference --      Peak Flow --      Pain Score 04/02/18 1017 4     Pain Loc --      Pain Edu? --      Excl. in GC? --     Constitutional: Alert and oriented. Well appearing and in no acute distress. Cardiovascular: Normal rate, regular rhythm. Grossly normal heart sounds.  Good peripheral circulation. Respiratory: Normal respiratory effort.  No retractions. Lungs CTAB. Musculoskeletal: No lower extremity tenderness nor edema.  No joint effusions. Neurologic:  Normal speech and language. No gross focal neurologic deficits are appreciated. No gait instability. Skin: 2- 3 mm area of erythema right buttocks.  No target shaped lesion.  Psychiatric: Mood and affect are normal. Speech and behavior are normal.  ____________________________________________   LABS (all labs ordered are listed, but only abnormal results are displayed)  Labs Reviewed - No data to display ____________________________________________  EKG   ____________________________________________  RADIOLOGY  ED MD interpretation:    Official radiology report(s): No results found.  ____________________________________________   PROCEDURES  Procedure(s) performed: None  Procedures  Critical Care performed: No  ____________________________________________   INITIAL IMPRESSION / ASSESSMENT AND PLAN / ED COURSE  As part of my medical decision making, I reviewed the following data within the electronic MEDICAL RECORD NUMBER    Insect bite right buttocks.  Patient versus concern and anxiety of suspected tick bite.  Advised we will treat prophylactically with  doxycycline.  Patient advised follow-up PCP in 7 to 10 days.  Return right ED if condition worsens.      ____________________________________________   FINAL CLINICAL IMPRESSION(S) / ED DIAGNOSES  Final diagnoses:  Tick bite of buttock, initial encounter     ED Discharge Orders         Ordered    doxycycline (MONODOX) 100 MG capsule  2 times daily     04/02/18 1047           Note:  This document was prepared using Dragon voice recognition software and may include unintentional dictation errors.    Joni ReiningSmith, Ronald K, PA-C 04/02/18 1057    Minna AntisPaduchowski, Kevin, MD 04/02/18 [phone]

## 2018-07-30 ENCOUNTER — Other Ambulatory Visit: Payer: Self-pay | Admitting: Physician Assistant

## 2018-07-30 ENCOUNTER — Ambulatory Visit
Admission: RE | Admit: 2018-07-30 | Discharge: 2018-07-30 | Disposition: A | Discharge status: Home / Self Care | Payer: Medicare Other | Source: Ambulatory Visit | Attending: Physician Assistant | Admitting: Physician Assistant

## 2018-07-30 DIAGNOSIS — M7989 Other specified soft tissue disorders: Secondary | ICD-10-CM

## 2018-07-30 DIAGNOSIS — I809 Phlebitis and thrombophlebitis of unspecified site: Secondary | ICD-10-CM | POA: Diagnosis not present

## 2018-07-30 DIAGNOSIS — R600 Localized edema: Secondary | ICD-10-CM | POA: Insufficient documentation

## 2018-08-27 ENCOUNTER — Other Ambulatory Visit: Payer: Self-pay | Admitting: Obstetrics and Gynecology

## 2018-08-27 DIAGNOSIS — Z13820 Encounter for screening for osteoporosis: Secondary | ICD-10-CM

## 2018-10-08 ENCOUNTER — Other Ambulatory Visit: Payer: Medicare Other

## 2019-03-12 ENCOUNTER — Other Ambulatory Visit: Payer: Self-pay

## 2019-03-12 DIAGNOSIS — Z20822 Contact with and (suspected) exposure to covid-19: Secondary | ICD-10-CM

## 2019-03-14 LAB — NOVEL CORONAVIRUS, NAA: SARS-CoV-2, NAA: NOT DETECTED

## 2019-03-19 ENCOUNTER — Telehealth: Payer: Self-pay | Admitting: Hematology

## 2019-03-19 NOTE — Telephone Encounter (Signed)
Pt is aware covid 19 test is negative °
